# Patient Record
Sex: Female | Born: 1985 | Race: Black or African American | Hispanic: No | Marital: Single | Smoking: Never smoker
Health system: Southern US, Community
[De-identification: ages and names within clinical notes are randomized; demographics above are authoritative.]

## PROBLEM LIST (undated history)

## (undated) DIAGNOSIS — Z789 Other specified health status: Secondary | ICD-10-CM

## (undated) HISTORY — PX: KNEE SURGERY: SHX244

---

## 2011-04-20 ENCOUNTER — Emergency Department (HOSPITAL_COMMUNITY)
Admission: EM | Admit: 2011-04-20 | Discharge: 2011-04-20 | Disposition: A | Discharge status: Home / Self Care | Payer: Self-pay | Attending: Emergency Medicine | Admitting: Emergency Medicine

## 2011-04-20 DIAGNOSIS — N61 Mastitis without abscess: Secondary | ICD-10-CM | POA: Insufficient documentation

## 2011-09-07 ENCOUNTER — Encounter (HOSPITAL_COMMUNITY): Payer: Self-pay | Admitting: *Deleted

## 2011-09-07 ENCOUNTER — Inpatient Hospital Stay (HOSPITAL_COMMUNITY): Payer: PRIVATE HEALTH INSURANCE

## 2011-09-07 ENCOUNTER — Inpatient Hospital Stay (HOSPITAL_COMMUNITY)
Admission: AD | Admit: 2011-09-07 | Discharge: 2011-09-07 | Disposition: A | Discharge status: Home / Self Care | Payer: PRIVATE HEALTH INSURANCE | Source: Ambulatory Visit | Attending: Obstetrics and Gynecology | Admitting: Obstetrics and Gynecology

## 2011-09-07 DIAGNOSIS — Z30431 Encounter for routine checking of intrauterine contraceptive device: Secondary | ICD-10-CM | POA: Insufficient documentation

## 2011-09-07 DIAGNOSIS — N926 Irregular menstruation, unspecified: Secondary | ICD-10-CM

## 2011-09-07 DIAGNOSIS — N938 Other specified abnormal uterine and vaginal bleeding: Secondary | ICD-10-CM | POA: Insufficient documentation

## 2011-09-07 DIAGNOSIS — N949 Unspecified condition associated with female genital organs and menstrual cycle: Secondary | ICD-10-CM | POA: Insufficient documentation

## 2011-09-07 HISTORY — DX: Other specified health status: Z78.9

## 2011-09-07 LAB — CBC
HCT: 38.4 % (ref 36.0–46.0)
Hemoglobin: 13 g/dL (ref 12.0–15.0)
MCHC: 33.9 g/dL (ref 30.0–36.0)
RBC: 4.13 MIL/uL (ref 3.87–5.11)
WBC: 6.4 10*3/uL (ref 4.0–10.5)

## 2011-09-07 MED ORDER — NAPROXEN SODIUM 550 MG PO TABS: 550.0000 mg | ORAL_TABLET | Freq: Two times a day (BID) | ORAL | Status: AC

## 2011-09-07 MED ORDER — MEDROXYPROGESTERONE ACETATE 10 MG PO TABS: 10.0000 mg | ORAL_TABLET | Freq: Every day | ORAL | Status: AC

## 2011-09-07 NOTE — Discharge Instructions (Signed)
Abnormal Uterine Bleeding Abnormal uterine bleeding can have many causes. Some cases are simply treated, while others are more serious. There are several kinds of bleeding that is considered abnormal, including:  Bleeding between periods.     Bleeding after sexual intercourse.     Spotting anytime in the menstrual cycle.     Bleeding heavier or more than normal.     Bleeding after menopause.  CAUSES   There are many causes of abnormal uterine bleeding. It can be present in teenagers, pregnant women, women during their reproductive years, and women who have reached menopause. Your caregiver will look for the more common causes depending on your age, signs, symptoms and your particular circumstance. Most cases are not serious and can be treated. Even the more serious causes, like cancer of the female organs, can be treated adequately if found in the early stages. That is why all types of bleeding should be evaluated and treated as soon as possible. DIAGNOSIS   Diagnosing the cause may take several kinds of tests. Your caregiver may:  Take a complete history of the type of bleeding.     Perform a complete physical exam and Pap smear.     Take an ultrasound on the abdomen showing a picture of the female organs and the pelvis.     Inject dye into the uterus and Fallopian tubes and X-ray them (hysterosalpingogram).     Place fluid in the uterus and do an ultrasound (sonohysterogrqphy).     Take a CT scan to examine the female organs and pelvis.     Take an MRI to examine the female organs and pelvis. There is no X-ray involved with this procedure.     Look inside the uterus with a telescope that has a light at the end (hysteroscopy).     Scrap the inside of the uterus to get tissue to examine (Dilatation and Curettage, D&C).     Look into the pelvis with a telescope that has a light at the end (laparoscopy). This is done through a very small cut (incision) in the abdomen.  TREATMENT     Treatment will depend on the cause of the abnormal bleeding. It can include:  Doing nothing to allow the problem to take care of itself over time.     Hormone treatment.     Birth control pills.     Treating the medical condition causing the problem.     Laparoscopy.    Major or minor surgery     Destroying the lining of the uterus with electrical currant, laser, freezing or heat (uterine ablation).  HOME CARE INSTRUCTIONS    Follow your caregiver's recommendation on how to treat your problem.     See your caregiver if you missed a menstrual period and think you may be pregnant.     If you are bleeding heavily, count the number of pads/tampons you use and how often you have to change them. Tell this to your caregiver.     Avoid sexual intercourse until the problem is controlled.  SEEK MEDICAL CARE IF:    You have any kind of abnormal bleeding mentioned above.     You feel dizzy at times.     You are 26 years old and have not had a menstrual period yet.  SEEK IMMEDIATE MEDICAL CARE IF:    You pass out.     You are changing pads/tampons every 15 to 30 minutes.     You have belly (abdominal)   pain.     You have a temperature of 100 F (37.8 C) or higher.     You become sweaty or weak.     You are passing large blood clots from the vagina.     You start to feel sick to your stomach (nauseous) and throw up (vomit).  Document Released: 07/11/2005 Document Revised: 03/23/2011 Document Reviewed: 12/04/2008 [employer] Patient Information 2012 [employer]. 

## 2011-09-07 NOTE — ED Provider Notes (Signed)
History   Pt presents today c/o vag bleeding and pain that comes and goes. She states the pain and bleeding has been present for about 1wk. She has an IUD that was place in 2010. She was seen yesterday and had STI testing. Her wet prep at that time showed clue cells. Pt brought all of her labs with her from that visit. Her urine preg test was negative at that time.  Chief Complaint  Patient presents with  . Dysmenorrhea   HPI  OB History    Grav Para Term Preterm Abortions TAB SAB Ect Mult Living   0 0              Past Medical History  Diagnosis Date  . No pertinent past medical history     Past Surgical History  Procedure Date  . Knee surgery     History reviewed. No pertinent family history.  History  Substance Use Topics  . Smoking status: Never Smoker   . Smokeless tobacco: Not on file  . Alcohol Use: Yes     occasionally    Allergies: No Known Allergies  No prescriptions prior to admission    Review of Systems  Constitutional: Negative for fever and chills.  Eyes: Negative for blurred vision and double vision.  Respiratory: Negative for cough, hemoptysis, sputum production, shortness of breath and wheezing.   Cardiovascular: Negative for chest pain and palpitations.  Gastrointestinal: Positive for nausea and abdominal pain. Negative for vomiting, diarrhea and constipation.  Genitourinary: Negative for dysuria.  Neurological: Negative for dizziness and headaches.  Psychiatric/Behavioral: Negative for depression and suicidal ideas.   Physical Exam   Blood pressure 110/78, pulse 68, temperature 98.3 F (36.8 C), temperature source Oral, resp. rate 16, height 5\' 5"  (1.651 m), weight 185 lb 12.8 oz (84.278 kg), last menstrual period 09/01/2011, SpO2 100.00%.  Physical Exam  Nursing note and vitals reviewed. Constitutional: She is oriented to person, place, and time. She appears well-developed and well-nourished. No distress.  HENT:  Head: Normocephalic and  atraumatic.  Eyes: EOM are normal. Pupils are equal, round, and reactive to light.  GI: Soft. She exhibits no distension and no mass. There is no tenderness. There is no rebound and no guarding.  Genitourinary: There is bleeding around the vagina. No vaginal discharge found.       Moderate amount of vag bleeding noted on exam. Cervix Lg/closed. No adnexal masses noted on exam.  Neurological: She is alert and oriented to person, place, and time.  Skin: Skin is warm and dry. She is not diaphoretic.  Psychiatric: She has a normal mood and affect. Her behavior is normal. Judgment and thought content normal.    MAU Course  Procedures  Results for orders placed during the hospital encounter of 09/07/11 (from the past 24 hour(s))  CBC     Status: Normal   Collection Time   09/07/11  1:28 PM      Component Value Range   WBC 6.4  4.0 - 10.5 (K/uL)   RBC 4.13  3.87 - 5.11 (MIL/uL)   Hemoglobin 13.0  12.0 - 15.0 (g/dL)   HCT 16.1  09.6 - 04.5 (%)   MCV 93.0  78.0 - 100.0 (fL)   MCH 31.5  26.0 - 34.0 (pg)   MCHC 33.9  30.0 - 36.0 (g/dL)   RDW 40.9  81.1 - 91.4 (%)   Platelets 209  150 - 400 (K/uL)    US shows IUD in correct position. No abnormalities  noted. Assessment and Plan  DUB: discussed with pt at length. Will give Rx for provera and anaprox ds. She will f/u with her PCP. Discussed diet, activity, risks, and precautions.  Clinton Gallant. Aviyah Swetz III, DrHSc, MPAS, PA-C  09/07/2011, 1:12 PM   Henrietta Hoover, PA 09/07/11 1501

## 2011-09-07 NOTE — Progress Notes (Signed)
Pt sent [facility] from [facility] due to lower abdominal pain and abnormal bleeding.  Was seen yesterday at [facility], had wet prep, gc/ch done- positive for bacterial vaginosis.  Has not started rx.  States pain started last Thursday and are sharp.  Has IUD placed 08/2008.  Denies any problems with IUD.  Has had no periods since 6/10 except intermittent couple days of light spotting. States bleeding started on Friday, going through 2-3 super pads a day.

## 2011-09-07 NOTE — Progress Notes (Signed)
Patient states she has a Mirena IUD since 2010. Started having bleeding and abdominal cramping on 2-7. 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 States she was given a Rx but has not started taking it yet.

## 2011-09-09 NOTE — ED Provider Notes (Signed)
Agree with above note.  Sylvia Adkins 09/09/2011 6:50 AM

## 2014-05-07 ENCOUNTER — Emergency Department: Payer: Self-pay | Admitting: Emergency Medicine

## 2014-05-07 LAB — URINALYSIS, COMPLETE
BACTERIA: NONE SEEN
BLOOD: NEGATIVE
Bilirubin,UR: NEGATIVE
Glucose,UR: NEGATIVE mg/dL (ref 0–75)
Ketone: NEGATIVE
Nitrite: NEGATIVE
PH: 5 (ref 4.5–8.0)
Protein: NEGATIVE
Specific Gravity: 1.012 (ref 1.003–1.030)
Squamous Epithelial: 7

## 2014-05-07 LAB — GC/CHLAMYDIA PROBE AMP

## 2014-05-07 LAB — COMPREHENSIVE METABOLIC PANEL
ALK PHOS: 44 U/L — AB
ANION GAP: 5 — AB (ref 7–16)
Albumin: 3.4 g/dL (ref 3.4–5.0)
BILIRUBIN TOTAL: 0.4 mg/dL (ref 0.2–1.0)
BUN: 6 mg/dL — AB (ref 7–18)
CALCIUM: 8.7 mg/dL (ref 8.5–10.1)
CHLORIDE: 106 mmol/L (ref 98–107)
CO2: 29 mmol/L (ref 21–32)
CREATININE: 0.96 mg/dL (ref 0.60–1.30)
GLUCOSE: 79 mg/dL (ref 65–99)
Osmolality: 276 (ref 275–301)
Potassium: 3.9 mmol/L (ref 3.5–5.1)
SGOT(AST): 17 U/L (ref 15–37)
SGPT (ALT): 14 U/L
Sodium: 140 mmol/L (ref 136–145)
TOTAL PROTEIN: 7.6 g/dL (ref 6.4–8.2)

## 2014-05-07 LAB — CBC
HCT: 38.6 % (ref 35.0–47.0)
HGB: 12.5 g/dL (ref 12.0–16.0)
MCH: 30.4 pg (ref 26.0–34.0)
MCHC: 32.3 g/dL (ref 32.0–36.0)
MCV: 94 fL (ref 80–100)
Platelet: 325 10*3/uL (ref 150–440)
RBC: 4.1 10*6/uL (ref 3.80–5.20)
RDW: 12.5 % (ref 11.5–14.5)
WBC: 7.8 10*3/uL (ref 3.6–11.0)

## 2014-05-07 LAB — LIPASE, BLOOD: LIPASE: 63 U/L — AB (ref 73–393)

## 2014-05-07 LAB — WET PREP, GENITAL

## 2015-06-13 IMAGING — US US PELV - US TRANSVAGINAL
1 series · 13 of 25 positions shown · non-contrast
Comparison: None

CLINICAL DATA: Lower abdominal crampy pain.

EXAM:
TRANSABDOMINAL AND TRANSVAGINAL ULTRASOUND OF PELVIS
TECHNIQUE: Both transabdominal and transvaginal ultrasound examinations of the
pelvis were performed. Transabdominal technique was performed for
global imaging of the pelvis including uterus, ovaries, adnexal
regions, and pelvic cul-de-sac. It was necessary to proceed with
endovaginal exam following the transabdominal exam to visualize the
uterus and ovaries.

[Series 1: us pelv - us transvaginal · 0.20mm/px · 13 of 130 slices shown]
[im 1/130]
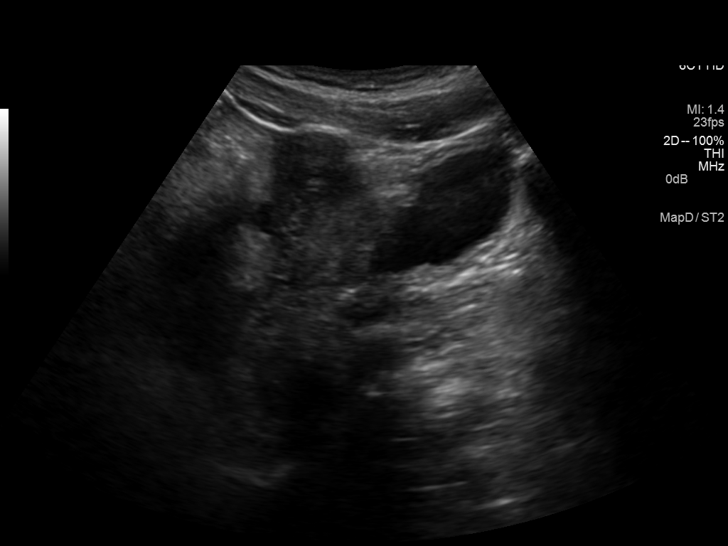
[im 11/130]
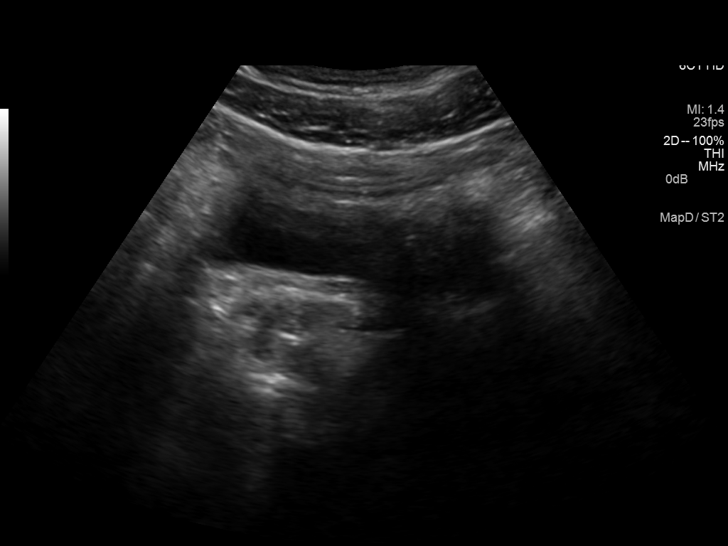
[im 22/130]
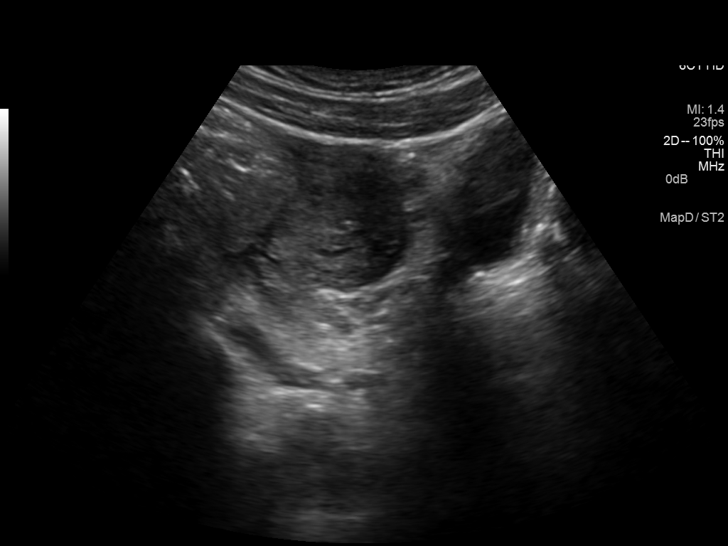
[im 33/130]
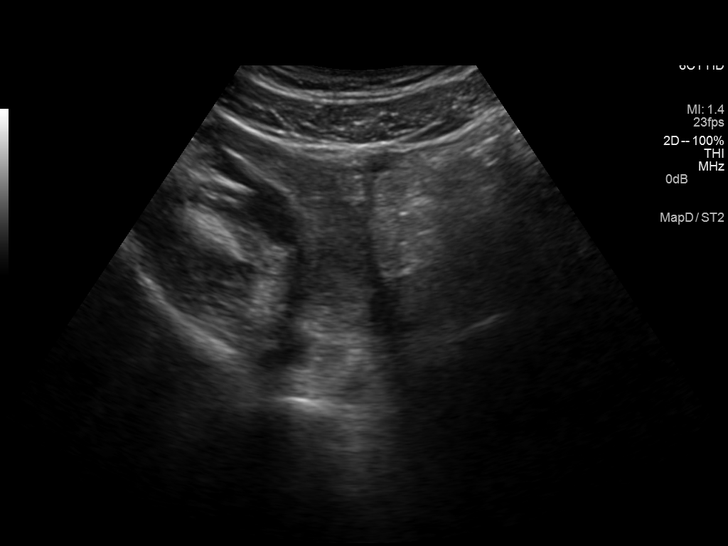
[im 44/130]
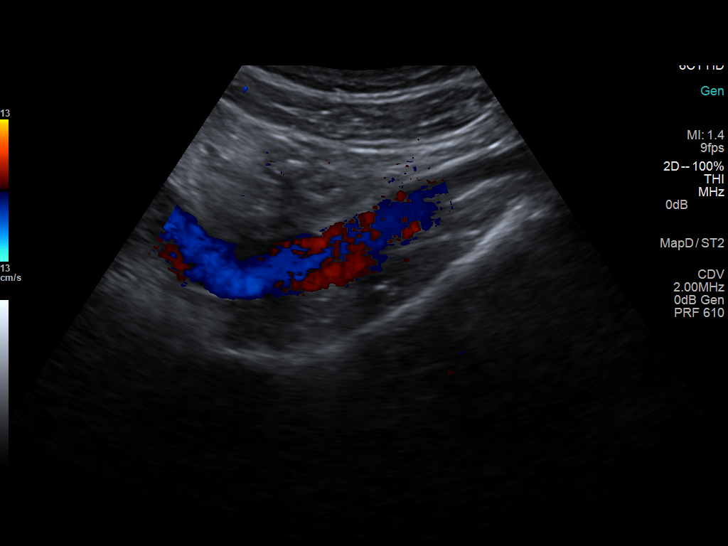
[im 54/130]
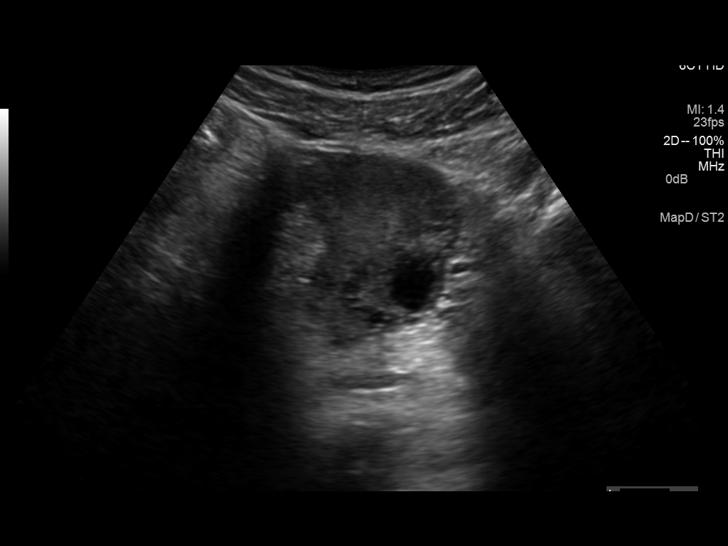
[im 65/130]
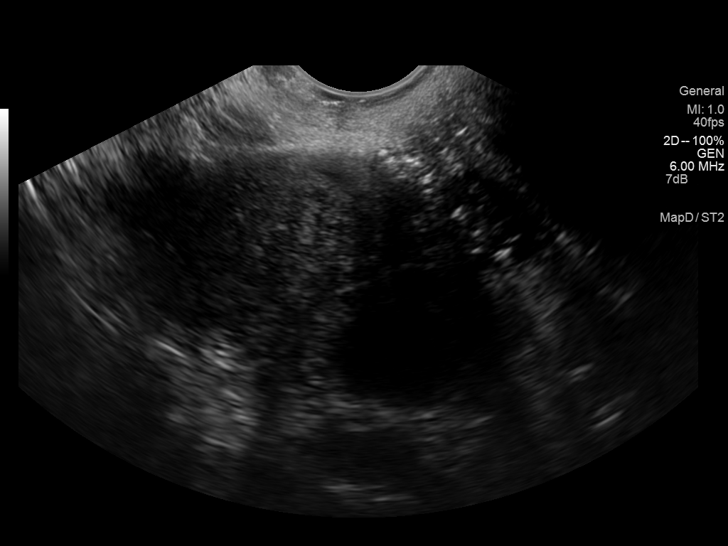
[im 76/130]
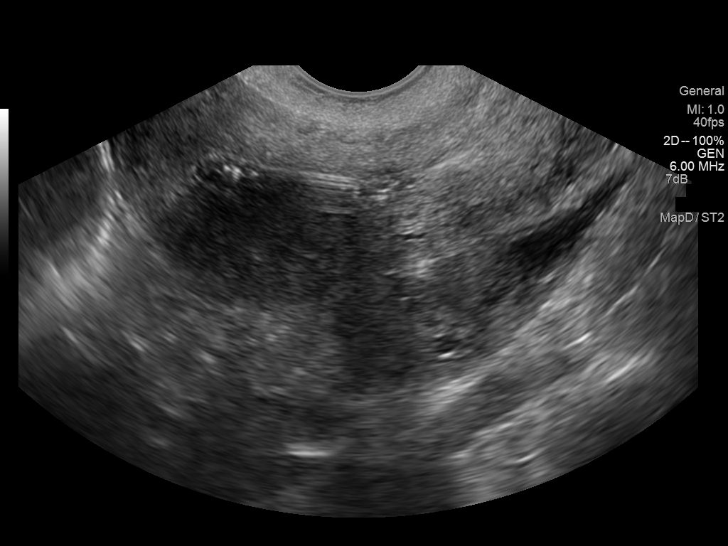
[im 87/130]
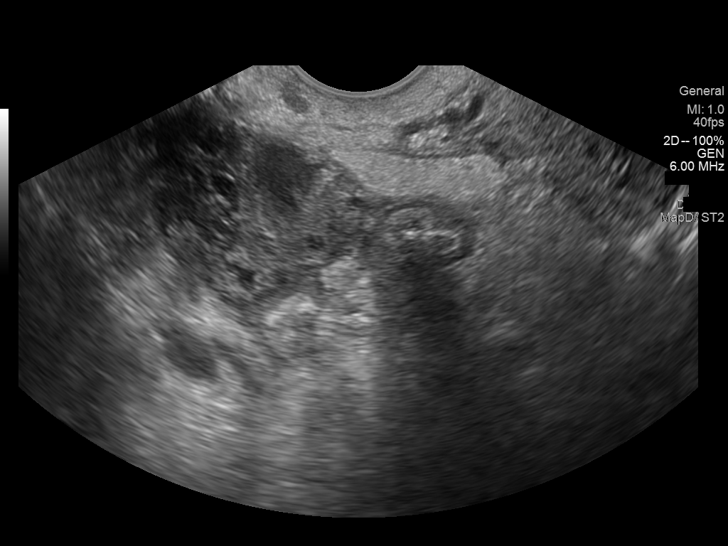
[im 97/130]
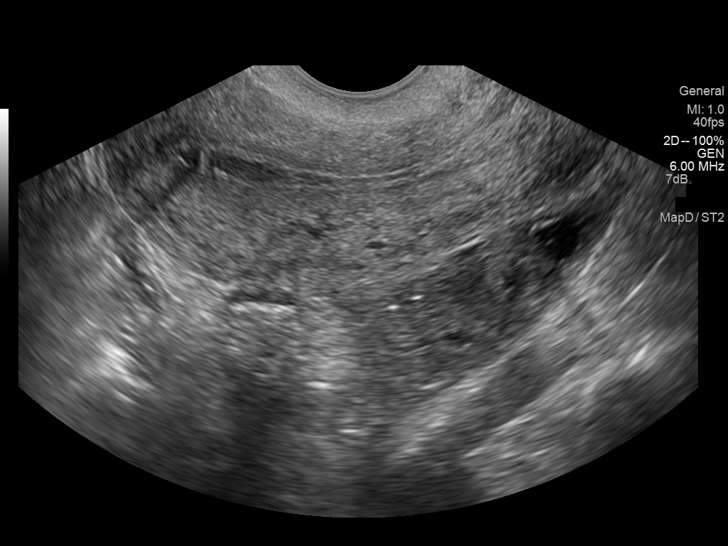
[im 108/130]
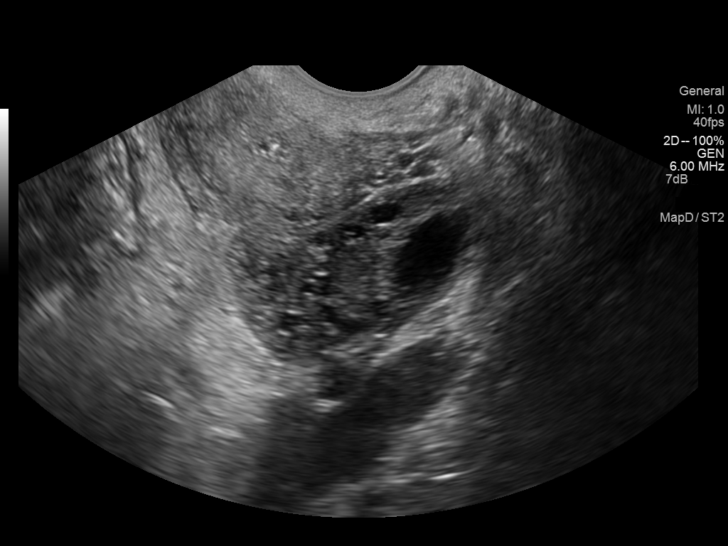
[im 119/130]
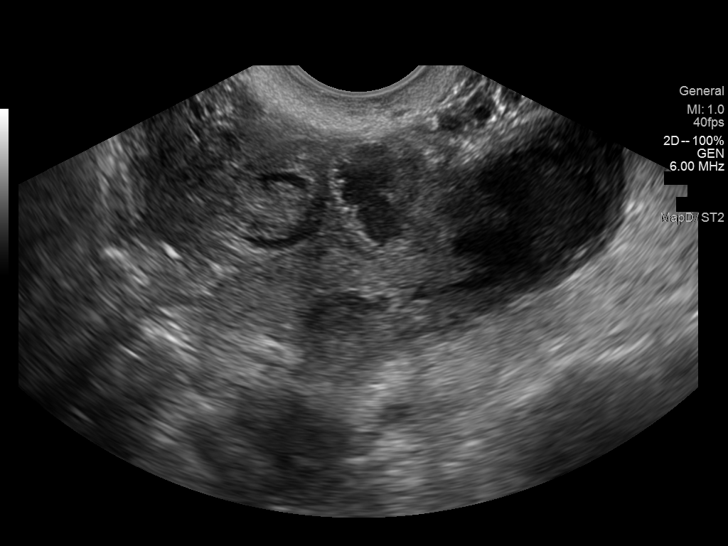
[im 130/130]
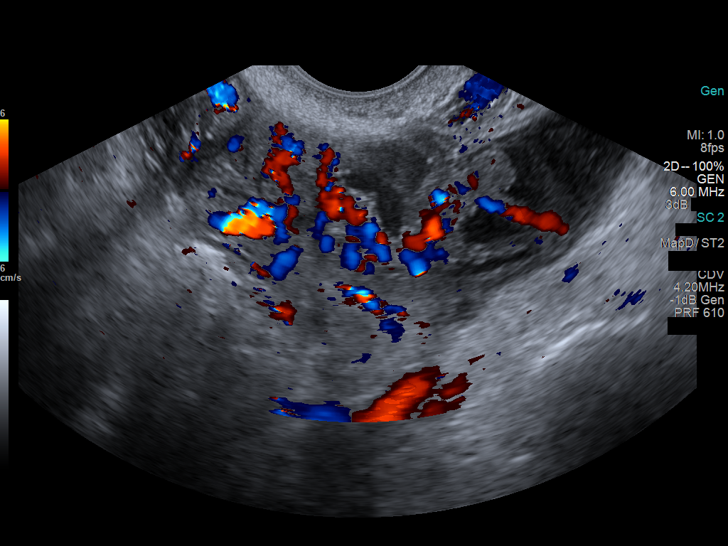

[13 of 25 positions shown; findings below may reference images not displayed]

FINDINGS: Uterus

Measurements: 8 x 5.2 x 4.5 cm. No fibroids or other mass
visualized.

Endometrium

Thickness: 5.4 min.  No focal abnormality visualized.  IUD noted.

Right ovary

Measurements: 3.5 x 1.9 x 2.6 cm. 7.8 x 3.3 x 4.3 cm ill-defined
process in the right adnexal region. This could represent bowel. A
right adnexal mass cannot be excluded . Adjacent bowel is noted.

Left ovary

Measurements: 3.9 x 2.5 x 2.6 cm. Normal appearance. 1.9 x 1.4 cm
simple left ovarian cyst.

Other findings

No free fluid.
IMPRESSION: 7.8 x 3.3 x 4.3 cm ill-defined process in the right adnexal region.
It is difficult to distinguish this from bowel. This could represent
bowel loops. To exclude a right adnexal mass a follow-up exam in 2
weeks is suggested. If the patient is pregnant the possibility of
ectopic pregnancy cannot be excluded .

## 2017-07-11 ENCOUNTER — Other Ambulatory Visit (HOSPITAL_COMMUNITY)
Admission: RE | Admit: 2017-07-11 | Discharge: 2017-07-11 | Disposition: A | Discharge status: Home / Self Care | Payer: BLUE CROSS/BLUE SHIELD | Source: Ambulatory Visit | Attending: Nurse Practitioner | Admitting: Nurse Practitioner

## 2017-07-11 ENCOUNTER — Other Ambulatory Visit: Payer: Self-pay | Admitting: Nurse Practitioner

## 2017-07-11 DIAGNOSIS — Z124 Encounter for screening for malignant neoplasm of cervix: Secondary | ICD-10-CM | POA: Insufficient documentation

## 2017-07-14 LAB — CYTOLOGY - PAP
Chlamydia: NEGATIVE
Diagnosis: NEGATIVE
HPV (WINDOPATH): DETECTED — AB
HPV 16/18/45 GENOTYPING: POSITIVE — AB
Neisseria Gonorrhea: NEGATIVE

## 2017-08-17 ENCOUNTER — Other Ambulatory Visit: Payer: Self-pay | Admitting: Nurse Practitioner

## 2018-04-17 ENCOUNTER — Other Ambulatory Visit: Payer: Self-pay

## 2018-04-17 ENCOUNTER — Emergency Department (HOSPITAL_COMMUNITY): Payer: BLUE CROSS/BLUE SHIELD

## 2018-04-17 ENCOUNTER — Encounter (HOSPITAL_COMMUNITY): Payer: Self-pay

## 2018-04-17 ENCOUNTER — Emergency Department (HOSPITAL_COMMUNITY)
Admission: EM | Admit: 2018-04-17 | Discharge: 2018-04-17 | Disposition: A | Discharge status: Home / Self Care | Payer: BLUE CROSS/BLUE SHIELD | Attending: Emergency Medicine | Admitting: Emergency Medicine

## 2018-04-17 DIAGNOSIS — M542 Cervicalgia: Secondary | ICD-10-CM | POA: Diagnosis present

## 2018-04-17 MED ORDER — METHOCARBAMOL 500 MG PO TABS: 500.0000 mg | ORAL_TABLET | Freq: Every evening | ORAL | 0 refills | Status: AC | PRN
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
## 2018-04-17 NOTE — Discharge Instructions (Signed)
Take NSAIDs or Tylenol as needed for the next week. Take this medicine with food. °Take muscle relaxer at bedtime to help you sleep. This medicine makes you drowsy so do not take before driving or work °Use a heating pad for sore muscles - use for 20 minutes several times a day °Return for worsening symptoms ° °

## 2018-04-17 NOTE — ED Notes (Signed)
Patient Alert and oriented to baseline. Stable and ambulatory to baseline. Patient verbalized understanding of the discharge instructions.  Patient belongings were taken by the patient.   
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
